# Patient Record
Sex: Male | Born: 1965 | Race: Black or African American | Hispanic: No | Marital: Married | Smoking: Never smoker
Health system: Southern US, Community
[De-identification: ages and names within clinical notes are randomized; demographics above are authoritative.]

---

## 2016-02-13 ENCOUNTER — Emergency Department (HOSPITAL_BASED_OUTPATIENT_CLINIC_OR_DEPARTMENT_OTHER)
Admission: EM | Admit: 2016-02-13 | Discharge: 2016-02-13 | Disposition: A | Discharge status: Home / Self Care | Payer: PRIVATE HEALTH INSURANCE | Attending: Emergency Medicine | Admitting: Emergency Medicine

## 2016-02-13 ENCOUNTER — Emergency Department (HOSPITAL_BASED_OUTPATIENT_CLINIC_OR_DEPARTMENT_OTHER): Payer: PRIVATE HEALTH INSURANCE

## 2016-02-13 ENCOUNTER — Encounter (HOSPITAL_BASED_OUTPATIENT_CLINIC_OR_DEPARTMENT_OTHER): Payer: Self-pay | Admitting: Emergency Medicine

## 2016-02-13 DIAGNOSIS — S6991XA Unspecified injury of right wrist, hand and finger(s), initial encounter: Secondary | ICD-10-CM | POA: Diagnosis present

## 2016-02-13 DIAGNOSIS — Y929 Unspecified place or not applicable: Secondary | ICD-10-CM | POA: Diagnosis not present

## 2016-02-13 DIAGNOSIS — Y939 Activity, unspecified: Secondary | ICD-10-CM | POA: Insufficient documentation

## 2016-02-13 DIAGNOSIS — S63501A Unspecified sprain of right wrist, initial encounter: Secondary | ICD-10-CM | POA: Insufficient documentation

## 2016-02-13 DIAGNOSIS — Y999 Unspecified external cause status: Secondary | ICD-10-CM | POA: Insufficient documentation

## 2016-02-13 DIAGNOSIS — W010XXA Fall on same level from slipping, tripping and stumbling without subsequent striking against object, initial encounter: Secondary | ICD-10-CM | POA: Diagnosis not present

## 2016-02-13 DIAGNOSIS — S80212A Abrasion, left knee, initial encounter: Secondary | ICD-10-CM | POA: Insufficient documentation

## 2016-02-13 DIAGNOSIS — W19XXXA Unspecified fall, initial encounter: Secondary | ICD-10-CM

## 2016-02-13 MED ORDER — HYDROCODONE-ACETAMINOPHEN 5-325 MG PO TABS
2.0000 | ORAL_TABLET | Freq: Once | ORAL | Status: AC
  Administered 2016-02-13: 2 via ORAL
  Filled 2016-02-13: qty 2

## 2016-02-13 MED ORDER — HYDROCODONE-ACETAMINOPHEN 5-325 MG PO TABS: 1.0000 | ORAL_TABLET | Freq: Four times a day (QID) | ORAL | Status: AC | PRN

## 2016-02-13 NOTE — ED Notes (Signed)
CMS intact before and after. Pt tolerated well. Pt had no questions.  

## 2016-02-13 NOTE — Discharge Instructions (Signed)
X-rays showed no broken bones. Wear the splint as needed for comfort. Take Tylenol for mild pain or the pain medicine prescribed for bad pain. Don't take Tylenol together with the pain medicine prescribed as the combination can be dangerous to your liver. Wash the abrasion on your knee daily with soap and water and then place a thin layer of bacitracin ointment over the wound and cover with a sterile bandage. Signs of infection include redness around the wound, more swelling, more pain or drainage from the wound. If you think you might be getting infected, return or see your primary care physician. See your primary care physician if continued to have significant pain or not improving in a week

## 2016-02-13 NOTE — ED Provider Notes (Signed)
CSN: [id number]     Arrival date & time 02/13/16  1803 History   By signing my name below, I, Arianna Nassar, attest that this documentation has been prepared under the direction and in the presence of Doug Sou, MD. Electronically Signed: Octavia Heir, ED Scribe. 02/13/2016. 6:25 PM.      Chief Complaint  Patient presents with  . Fall      The history is provided by the patient. No language interpreter was used.   HPI Comments: Omar Martinez is a 50 y.o. male who presents to the Emergency Department complaining of a fall that occurred about 30 minutes ago.He reports that he tripped on uneven pavement He complains of constant, gradual worsening, moderate, throbbing right wrist pain with associated burning left knee pain. Pt reports he stepped off of a curb, tumbled over, caught himself with his right wrist, and landed on his left knee. He has not taken any medication to alleviate his pain. He denies drug use and smoking. Pt states he is an occasional drinker. Last tetanus shot 2 years ago  History reviewed. No pertinent past medical history. History reviewed. No pertinent past surgical history. Past medical history negative History reviewed. No pertinent family history. Social History  Substance Use Topics  . Smoking status: Never Smoker   . Smokeless tobacco: None  . Alcohol Use: Yes     Comment: socially    Review of Systems  Constitutional: Negative.   HENT: Negative.   Respiratory: Negative.   Cardiovascular: Negative.   Gastrointestinal: Negative.   Musculoskeletal: Positive for arthralgias.  Skin: Positive for wound.       Abrasion to left knee  Neurological: Negative.   Psychiatric/Behavioral: Negative.       Allergies  Review of patient's allergies indicates no known allergies.  Home Medications   Prior to Admission medications   Not on File   Triage vitals: BP 132/87 mmHg  Pulse 106  Temp(Src) 98.3 F (36.8 C) (Oral)  Resp 18  Ht  (1.854 m)   Wt 235 lb (106.595 kg)  BMI 31.01 kg/m2  SpO2 99% Physical Exam  Constitutional: He is oriented to person, place, and time. He appears well-developed and well-nourished.  HENT:  Head: Normocephalic and atraumatic.  Eyes: Conjunctivae are normal. Pupils are equal, round, and reactive to light.  Neck: Neck supple. No tracheal deviation present. No thyromegaly present.  Cardiovascular: Normal rate and regular rhythm.   No murmur heard. Pulmonary/Chest: Effort normal and breath sounds normal.  Abdominal: Soft. Bowel sounds are normal. He exhibits no distension. There is no tenderness.  Musculoskeletal: Normal range of motion. He exhibits tenderness. He exhibits no edema.  Left upper extremity no swelling no deformity minimally tender dorsum of left wrist. No anatomic snuffbox tenderness. Full range of motion. Radial pulse 2+. Good capillary refill. Left lower extremity 3 cm abrasion to anterior knee with mild corresponding tenderness. No deformity. All other extremities without contusion abrasion or tenderness neurovascularly intact  Neurological: He is alert and oriented to person, place, and time. No cranial nerve deficit. Coordination normal.  Gait normal. Walks without limp  Skin: Skin is warm and dry. No rash noted.  Psychiatric: He has a normal mood and affect.  Nursing note and vitals reviewed.   ED Course  Procedures  DIAGNOSTIC STUDIES: Oxygen Saturation is 99% on RA, normal by my interpretation.  COORDINATION OF CARE:  6:25 PM Will order x-ray of right wrist and left knee. Discussed treatment plan with pt at bedside  and pt agreed to plan.  Labs Review Labs Reviewed - No data to display  Imaging Review No results found. I have personally reviewed and evaluated these images and lab results as part of my medical decision-making.   EKG Interpretation None     Declines pain medicine 6:55 PM requesting pain medicine. Right wrist is sore. Norco ordered as well as Velcro  wrist splint. Abrasion at left knee was cleansed, antibiotic ointment applied. X-rays viewed by me No results found for this or any previous visit. Dg Wrist Complete Right  02/13/2016  CLINICAL DATA:  Right wrist pain status post fall. EXAM: RIGHT WRIST - COMPLETE 3+ VIEW COMPARISON:  None. FINDINGS: There is no evidence of fracture or dislocation. There is no evidence of arthropathy or other focal bone abnormality. Soft tissues are unremarkable. IMPRESSION: Negative. Electronically Signed   By: Ted Mcalpineobrinka  Dimitrova M.D.   On: 02/13/2016 18:46   Dg Knee Complete 4 Views Left  02/13/2016  CLINICAL DATA:  Left knee pain status post fall. EXAM: LEFT KNEE - COMPLETE 4+ VIEW COMPARISON:  None. FINDINGS: No evidence of fracture, dislocation, or joint effusion. No evidence of arthropathy or other focal bone abnormality. Soft tissues are unremarkable. IMPRESSION: Negative. Electronically Signed   By: Ted Mcalpineobrinka  Dimitrova M.D.   On: 02/13/2016 18:47    MDM  Plan Velcro wrist splint. Prescription Norco. Local wound care to left knee. Follow-up with PMD if having significant pain by next week Diagnosis #1 fall  #2 sprain of right wrist #3 abrasion to left knee  Final diagnoses:  None     I personally performed the services described in this documentation, which was scribed in my presence. The recorded information has been reviewed and considered.    Doug SouSam Evanny Ellerbe, MD 02/13/16 [phone]

## 2016-02-13 NOTE — ED Notes (Signed)
Patient states that he fell earlier today. Pain to his left knee and to his right wrist.

## 2020-08-13 ENCOUNTER — Emergency Department (HOSPITAL_BASED_OUTPATIENT_CLINIC_OR_DEPARTMENT_OTHER)
Admission: EM | Admit: 2020-08-13 | Discharge: 2020-08-13 | Disposition: A | Discharge status: Home / Self Care | Payer: BC Managed Care – PPO | Attending: Emergency Medicine | Admitting: Emergency Medicine

## 2020-08-13 ENCOUNTER — Other Ambulatory Visit: Payer: Self-pay

## 2020-08-13 ENCOUNTER — Emergency Department (HOSPITAL_BASED_OUTPATIENT_CLINIC_OR_DEPARTMENT_OTHER): Payer: BC Managed Care – PPO

## 2020-08-13 ENCOUNTER — Encounter (HOSPITAL_BASED_OUTPATIENT_CLINIC_OR_DEPARTMENT_OTHER): Payer: Self-pay | Admitting: Emergency Medicine

## 2020-08-13 DIAGNOSIS — W11XXXA Fall on and from ladder, initial encounter: Secondary | ICD-10-CM | POA: Insufficient documentation

## 2020-08-13 DIAGNOSIS — S8264XA Nondisplaced fracture of lateral malleolus of right fibula, initial encounter for closed fracture: Secondary | ICD-10-CM | POA: Insufficient documentation

## 2020-08-13 DIAGNOSIS — M25511 Pain in right shoulder: Secondary | ICD-10-CM | POA: Diagnosis not present

## 2020-08-13 DIAGNOSIS — S99921A Unspecified injury of right foot, initial encounter: Secondary | ICD-10-CM | POA: Diagnosis present

## 2020-08-13 DIAGNOSIS — Y92009 Unspecified place in unspecified non-institutional (private) residence as the place of occurrence of the external cause: Secondary | ICD-10-CM | POA: Insufficient documentation

## 2020-08-13 DIAGNOSIS — W19XXXA Unspecified fall, initial encounter: Secondary | ICD-10-CM

## 2020-08-13 DIAGNOSIS — S82891A Other fracture of right lower leg, initial encounter for closed fracture: Secondary | ICD-10-CM

## 2020-08-13 MED ORDER — SENNOSIDES-DOCUSATE SODIUM 8.6-50 MG PO TABS: 1.0000 | ORAL_TABLET | Freq: Every day | ORAL | 0 refills | Status: AC

## 2020-08-13 MED ORDER — IBUPROFEN 600 MG PO TABS: 600.0000 mg | ORAL_TABLET | Freq: Four times a day (QID) | ORAL | 0 refills | Status: AC | PRN

## 2020-08-13 MED ORDER — OXYCODONE-ACETAMINOPHEN 5-325 MG PO TABS
1.0000 | ORAL_TABLET | Freq: Once | ORAL | Status: AC
  Administered 2020-08-13: 1 via ORAL
  Filled 2020-08-13: qty 1

## 2020-08-13 MED ORDER — OXYCODONE-ACETAMINOPHEN 5-325 MG PO TABS: 1.0000 | ORAL_TABLET | Freq: Four times a day (QID) | ORAL | 0 refills | Status: AC | PRN

## 2020-08-13 MED ORDER — IBUPROFEN 800 MG PO TABS
800.0000 mg | ORAL_TABLET | Freq: Once | ORAL | Status: AC
  Administered 2020-08-13: 800 mg via ORAL
  Filled 2020-08-13: qty 1

## 2020-08-13 NOTE — ED Triage Notes (Signed)
Pt reports he fell approximately 10 feet off of a ladder inside his house while hanging christmas decorations yesterday. He landed on his R side. C/o R shoulder and R heel pain. Denies LOC or hitting his head, denies neck or back pain.

## 2020-08-13 NOTE — Discharge Instructions (Addendum)
You have a fracture on the side of your right ankle that we found on the CT scan.  We placed you in a cam boot for this and gave you crutches.  This boot will help stabilize your leg to prevent it from giving out and worsening your fracture.  You should use it at all times when walking.  I advise that you limit as much as possible putting your weight on this right leg.  I understand that you will need to apply some light touches when walking.  I advised that you keep your leg elevated at home as much as possible.  You should call and schedule an appointment with Dr Dion Saucier in 1-2 weeks in the office for this.  Your CT scan did NOT show a fracture of your right shoulder.  You can have the orthopedic doctor take another look at this again in the office, but for now you can use your right arm for basic activities.  Avoid sports or heavy lifting with this arm.  You can continue taking Motrin at home for pain.  I also prescribed you a few Percocet tablets for breakthrough pain.  I prescribed you some stool softener to take with this, as Percocet can often cause constipation.

## 2020-08-13 NOTE — ED Notes (Signed)
PT getting dressed  

## 2020-08-13 NOTE — ED Provider Notes (Signed)
[facility] EMERGENCY DEPARTMENT Provider Note   CSN: [id number] Arrival date & time: 08/13/20  0915     History Chief Complaint  Patient presents with  . Fall    Omar Martinez is a 54 y.o. male with no significant past medical history presented emergency department with mechanical fall off a ladder at home.  The patient was approximately 8 feet in the air decorating his house for Christmas.  He says the ladder gave out underneath him, and he fell to the ground in his house.  He thinks he may have landed on his right foot and heel and then onto his right shoulder.  He was reporting significant pain in his right heel, only able to limp due to pain.  He is also having pain in his right shoulder but is able to range the shoulder fully.  He denies any numbness or weakness of the extremities.  He denies any head trauma or loss of consciousness.  He is not on blood thinners.  He denies any low back pain.  He denies pain anywhere else on his body.  HPI     History reviewed. No pertinent past medical history.  There are no problems to display for this patient.   History reviewed. No pertinent surgical history.     No family history on file.  Social History   Tobacco Use  . Smoking status: Never Smoker  . Smokeless tobacco: Never Used  Substance Use Topics  . Alcohol use: Yes    Comment: socially  . Drug use: Not on file    Home Medications Prior to Admission medications   Medication Sig Start Date End Date Taking? Authorizing Provider  HYDROcodone-acetaminophen (NORCO) 5-325 MG tablet Take 1-2 tablets by mouth every 6 (six) hours as needed for moderate pain or severe pain. 02/13/16   Doug Sou, MD  ibuprofen (ADVIL) 600 MG tablet Take 1 tablet (600 mg total) by mouth every 6 (six) hours as needed for up to 30 doses for moderate pain. 08/13/20   Terald Sleeper, MD  oxyCODONE-acetaminophen (PERCOCET/ROXICET) 5-325 MG tablet Take 1 tablet by mouth every 6 (six)  hours as needed for up to 3 days for severe pain. 08/13/20 08/16/20  Terald Sleeper, MD  senna-docusate (SENOKOT-S) 8.6-50 MG tablet Take 1 tablet by mouth daily for 15 doses. Take with percocet to prevent constipation 08/13/20 08/28/20  Terald Sleeper, MD    Allergies    Patient has no known allergies.  Review of Systems   Review of Systems  Constitutional: Negative for chills and fever.  HENT: Negative for ear pain and sore throat.   Eyes: Negative for photophobia and visual disturbance.  Respiratory: Negative for cough and shortness of breath.   Cardiovascular: Negative for chest pain and palpitations.  Gastrointestinal: Negative for abdominal pain and vomiting.  Musculoskeletal: Positive for arthralgias and myalgias. Negative for back pain.  Skin: Negative for color change and rash.  Neurological: Negative for weakness and numbness.  All other systems reviewed and are negative.   Physical Exam Updated Vital Signs BP (!) 136/91 (BP Location: Right Arm)   Pulse 72   Temp 98.9 F (37.2 C) (Oral)   Resp 19   Ht 6\' 1"  (1.854 m)   Wt 106.6 kg   SpO2 97%   BMI 31.00 kg/m   Physical Exam Vitals and nursing note reviewed.  Constitutional:      Appearance: He is well-developed.  HENT:     Head: Normocephalic and  atraumatic.  Eyes:     Conjunctiva/sclera: Conjunctivae normal.  Cardiovascular:     Rate and Rhythm: Normal rate and regular rhythm.     Pulses: Normal pulses.  Pulmonary:     Effort: Pulmonary effort is normal. No respiratory distress.     Breath sounds: Normal breath sounds.  Abdominal:     General: There is no distension.     Palpations: Abdomen is soft.     Tenderness: There is no abdominal tenderness.  Musculoskeletal:     Cervical back: Neck supple.     Comments: TTP of mid-humerus right side with no gross deformity or bruising Full ROM of the joints and extremities No ttp of the chest or pelvis +TTP of right calcaneus, no midfoot or posterior  malleolar ttp  Skin:    General: Skin is warm and dry.  Neurological:     General: No focal deficit present.     Mental Status: He is alert and oriented to person, place, and time.     Sensory: No sensory deficit.     Motor: No weakness.     Comments: No spinal midline tenderness  Psychiatric:        Mood and Affect: Mood normal.        Behavior: Behavior normal.     ED Results / Procedures / Treatments   Labs (all labs ordered are listed, but only abnormal results are displayed) Labs Reviewed - No data to display  EKG None  Radiology DG Shoulder Right  Result Date: 08/13/2020 CLINICAL DATA:  Fall from ladder with shoulder pain, initial encounter EXAM: RIGHT SHOULDER - 2+ VIEW COMPARISON:  None. FINDINGS: There is a lucency noted in the proximal humeral shaft seen only on the trans axillary view suspicious for undisplaced fracture. No other focal abnormality is noted. Cross-sectional imaging may be helpful for further evaluation. IMPRESSION: Oblique lucency seen on the trans axillary view only suspicious for undisplaced cortical fracture. Electronically Signed   By: Alcide Clever M.D.   On: 08/13/2020 10:04   CT HUMERUS RIGHT WO CONTRAST  Result Date: 08/13/2020 CLINICAL DATA:  Status post fall, humeral pain. EXAM: CT OF THE RIGHT HUMERUS WITHOUT CONTRAST TECHNIQUE: Multidetector CT imaging was performed according to the standard protocol. Multiplanar CT image reconstructions were also generated. COMPARISON:  None. FINDINGS: Bones/Joint/Cartilage No fracture or dislocation. Normal alignment. No joint effusion. Ligaments Ligaments are suboptimally evaluated by CT. Muscles and Tendons Muscles are normal.  No intramuscular fluid collection or hematoma. Soft tissue No fluid collection or hematoma.  No soft tissue mass. IMPRESSION: No acute osseous injury of the right shoulder. Electronically Signed   By: Elige Ko   On: 08/13/2020 11:43   CT Foot Right Wo Contrast  Result Date:  08/13/2020 CLINICAL DATA:  Status post fall from a ladder hanging Christmas lights. Calcaneal pain. EXAM: CT OF THE RIGHT FOOT WITHOUT CONTRAST TECHNIQUE: Multidetector CT imaging of the right foot was performed according to the standard protocol. Multiplanar CT image reconstructions were also generated. COMPARISON:  None. FINDINGS: Bones/Joint/Cartilage Nondisplaced transverse fracture of the distal tip of the lateral malleolus. Normal alignment. No joint effusion. 9 mm osteochondral lesion of the posterolateral corner of the talar dome. Mild osteoarthritis of the tibiotalar joint. Small loose body in the posterior ankle joint space. 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 Ligaments Ligaments are  suboptimally evaluated by CT. Muscles and Tendons Muscles are normal. No muscle atrophy. No intramuscular fluid collection or hematoma. Soft tissue No fluid collection or hematoma.  No soft tissue mass. IMPRESSION: 1. Nondisplaced transverse fracture of the distal tip of the lateral malleolus. 2. A 9 mm osteochondral lesion of the posterolateral corner of the talar dome. Mild osteoarthritis of the tibiotalar joint. Electronically Signed   By: Elige Ko   On: 08/13/2020 12:18   DG Foot Complete Right  Result Date: 08/13/2020 CLINICAL DATA:  Fall from ladder. EXAM: RIGHT FOOT COMPLETE - 3+ VIEW COMPARISON:  None. FINDINGS: There is no evidence of fracture or dislocation. There is no evidence of arthropathy or other focal bone abnormality. Soft tissues are unremarkable. IMPRESSION: Negative. Electronically Signed   By: Kennith Center M.D.   On: 08/13/2020 10:04    Procedures Procedures (including critical care time)  Medications Ordered in ED Medications  oxyCODONE-acetaminophen (PERCOCET/ROXICET) 5-325 MG per tablet 1 tablet (1 tablet Oral Given  08/13/20 1055)  ibuprofen (ADVIL) tablet 800 mg (800 mg Oral Given 08/13/20 1055)    ED Course  I have reviewed the triage vital signs and the nursing notes.  Pertinent labs & imaging results that were available during my care of the patient were reviewed by me and considered in my medical decision making (see chart for details).  This is a 54% emergency department a fall from approximately 8 feet off a ladder yesterday.  He has pain and tenderness in the right calcaneus.  X-rays reviewed showed no acute fracture, however I do think a CT scan to be warranted as I have a strong suspicion for fracture here.  He has no lumbar or lower back pain whatsoever.  He has no tenderness over the spine.  I have a low suspicion for a concurrent lumbar fracture at this time.  X-ray of the right humerus shows a possible hairline fracture of the mid humerus.  This correlates with area of tenderness on exam.  We will pursue a CT scan to better evaluate this.  He has no tenderness of the scapula or evidence of dislocation of the shoulder.  Remainder of his trauma evaluation is benign.  No evidence of head trauma.  No evidence of chest trauma.  No pelvic instability or abdominal pain.  We can give him p.o. Percocet and Motrin for pain while he is here.  His wife will be driving him.  Clinical Course as of Aug 14 1032  Sun Aug 13, 2020  1153 No acute fracture on humerus xray    [MT]  1222 IMPRESSION: 1. Nondisplaced transverse fracture of the distal tip of the lateral malleolus. 2. A 9 mm osteochondral lesion of the posterolateral corner of the talar dome. Mild osteoarthritis of the tibiotalar joint.   [MT]  1248 I discussed the patient CT findings with him.  I would recommend he follow-up with orthopedic doctor.  We discussed a splint with crutches, but given the pain he is having in his right arm, does not feel like he may be able to keep off this leg completely.  The alternative would be a cam boot, which  can still provide some stability to his ankle, until he can see an orthopedic doctor.  Advised him to try to keep off of this leg is much as possible and apply light touch only when ambulating.  We will still provide him with some crutches.  He verbalized understanding and agrees with this plan.   [MT]  Clinical Course User Index [MT] Terald Sleeperrifan, Corben Auzenne J, MD    Final Clinical Impression(s) / ED Diagnoses Final diagnoses:  Closed fracture of right ankle, initial encounter  Acute pain of right shoulder  Fall, initial encounter    Rx / DC Orders ED Discharge Orders         Ordered    ibuprofen (ADVIL) 600 MG tablet  Every 6 hours PRN        08/13/20 1254    oxyCODONE-acetaminophen (PERCOCET/ROXICET) 5-325 MG tablet  Every 6 hours PRN        08/13/20 1254    senna-docusate (SENOKOT-S) 8.6-50 MG tablet  Daily        08/13/20 1254           Terald Sleeperrifan, Maynor Mwangi J, MD 08/14/20 [phone]

## 2022-05-26 IMAGING — DX DG SHOULDER 2+V*R*
3 series · 3 of 3 positions shown · non-contrast
Comparison: None.

CLINICAL DATA: Fall from ladder with shoulder pain, initial
encounter

EXAM:
RIGHT SHOULDER - 2+ VIEW

[shoulder grashey]
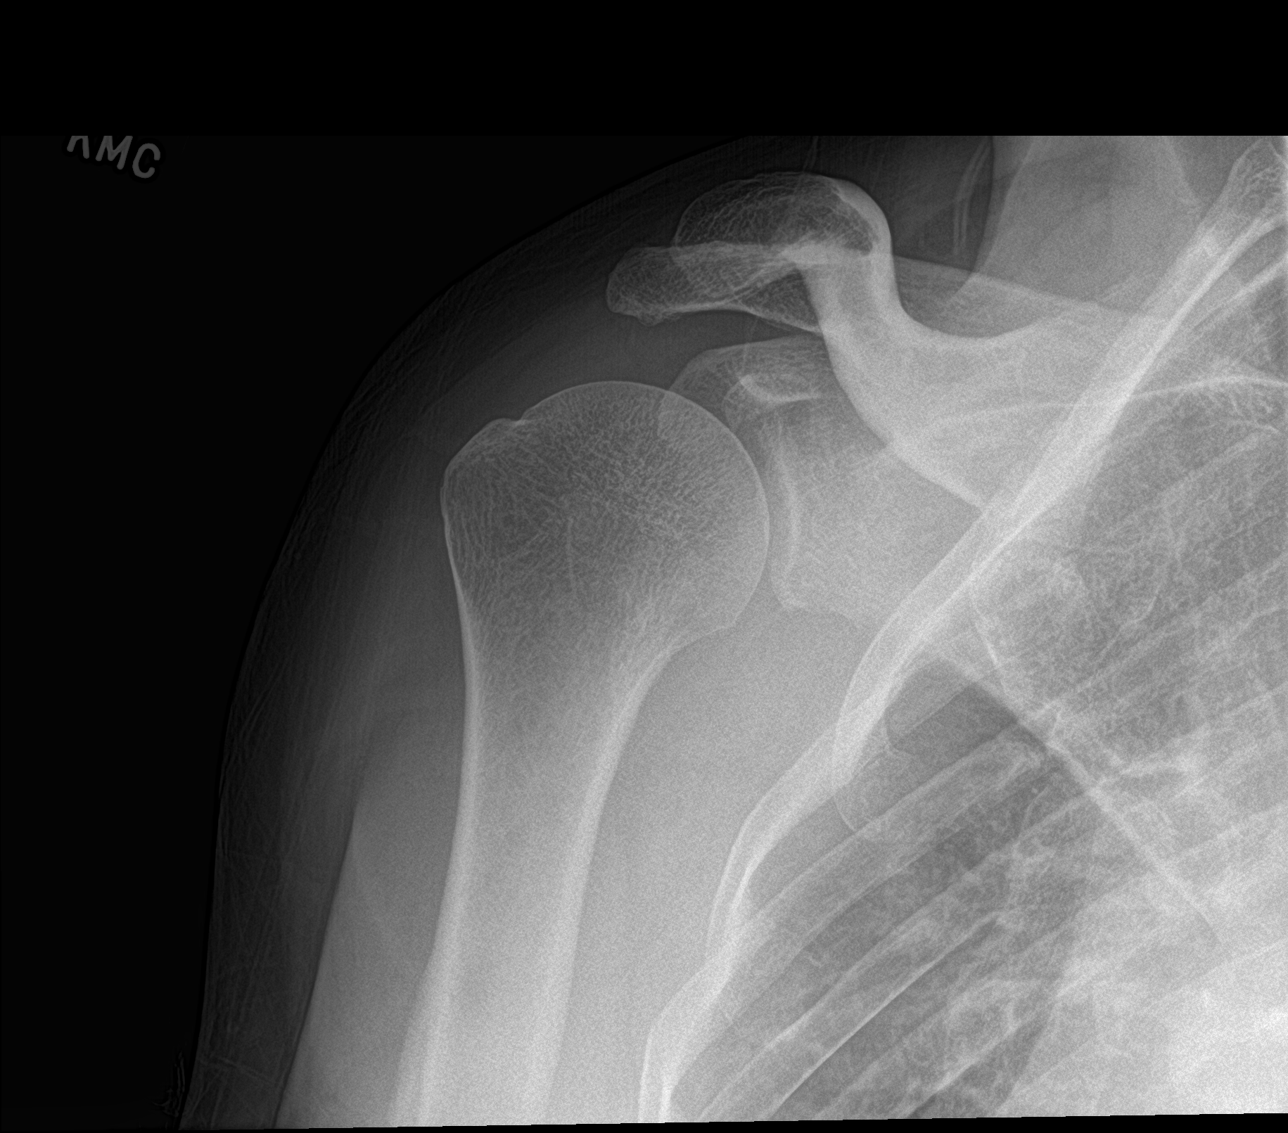

[shoulder y view]
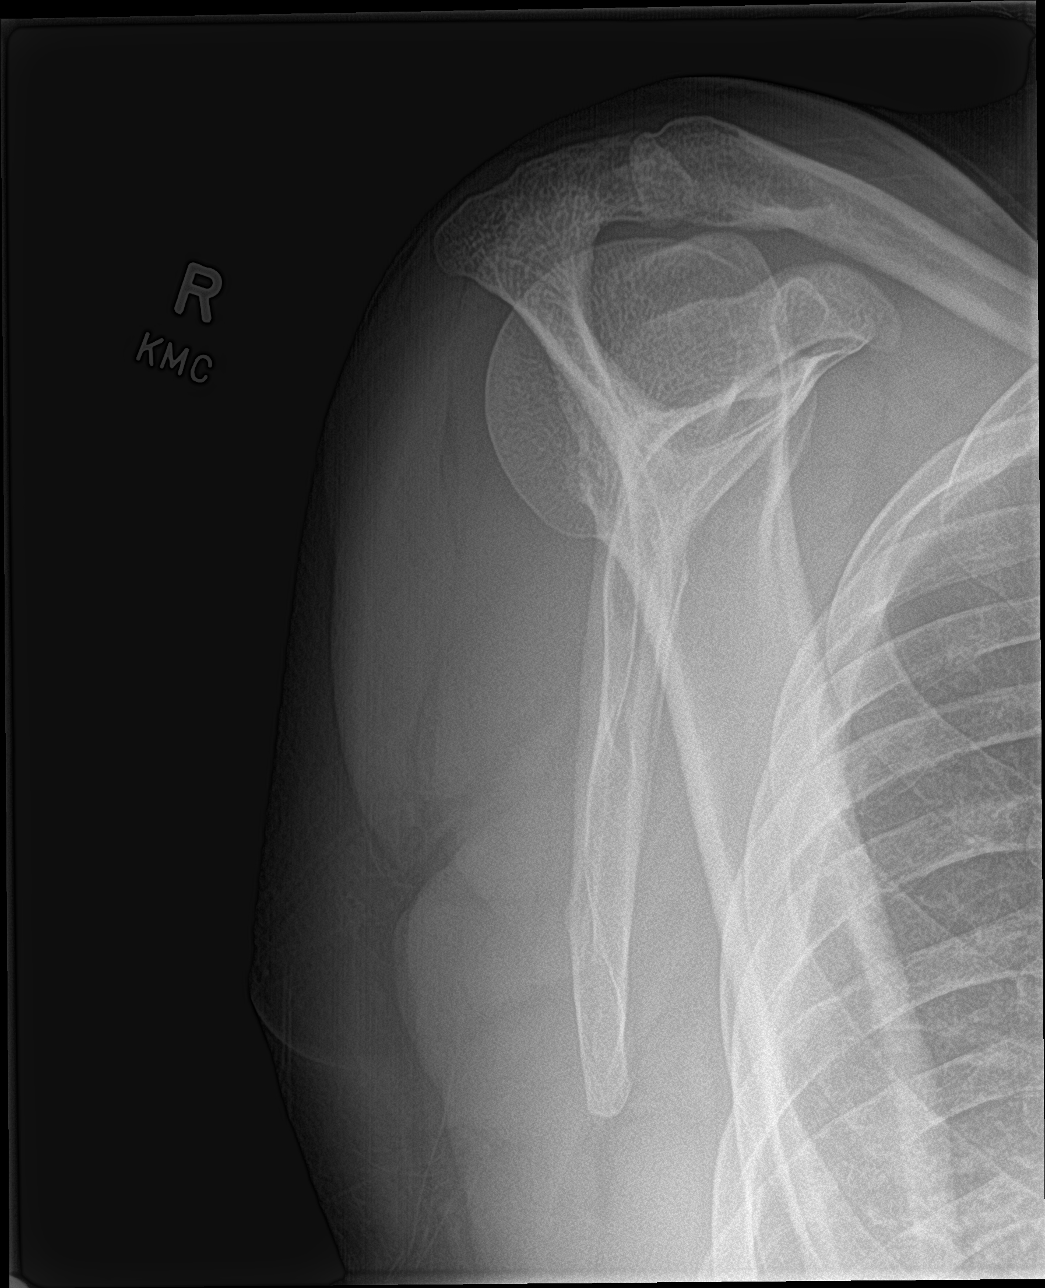

[shoulder axillary]
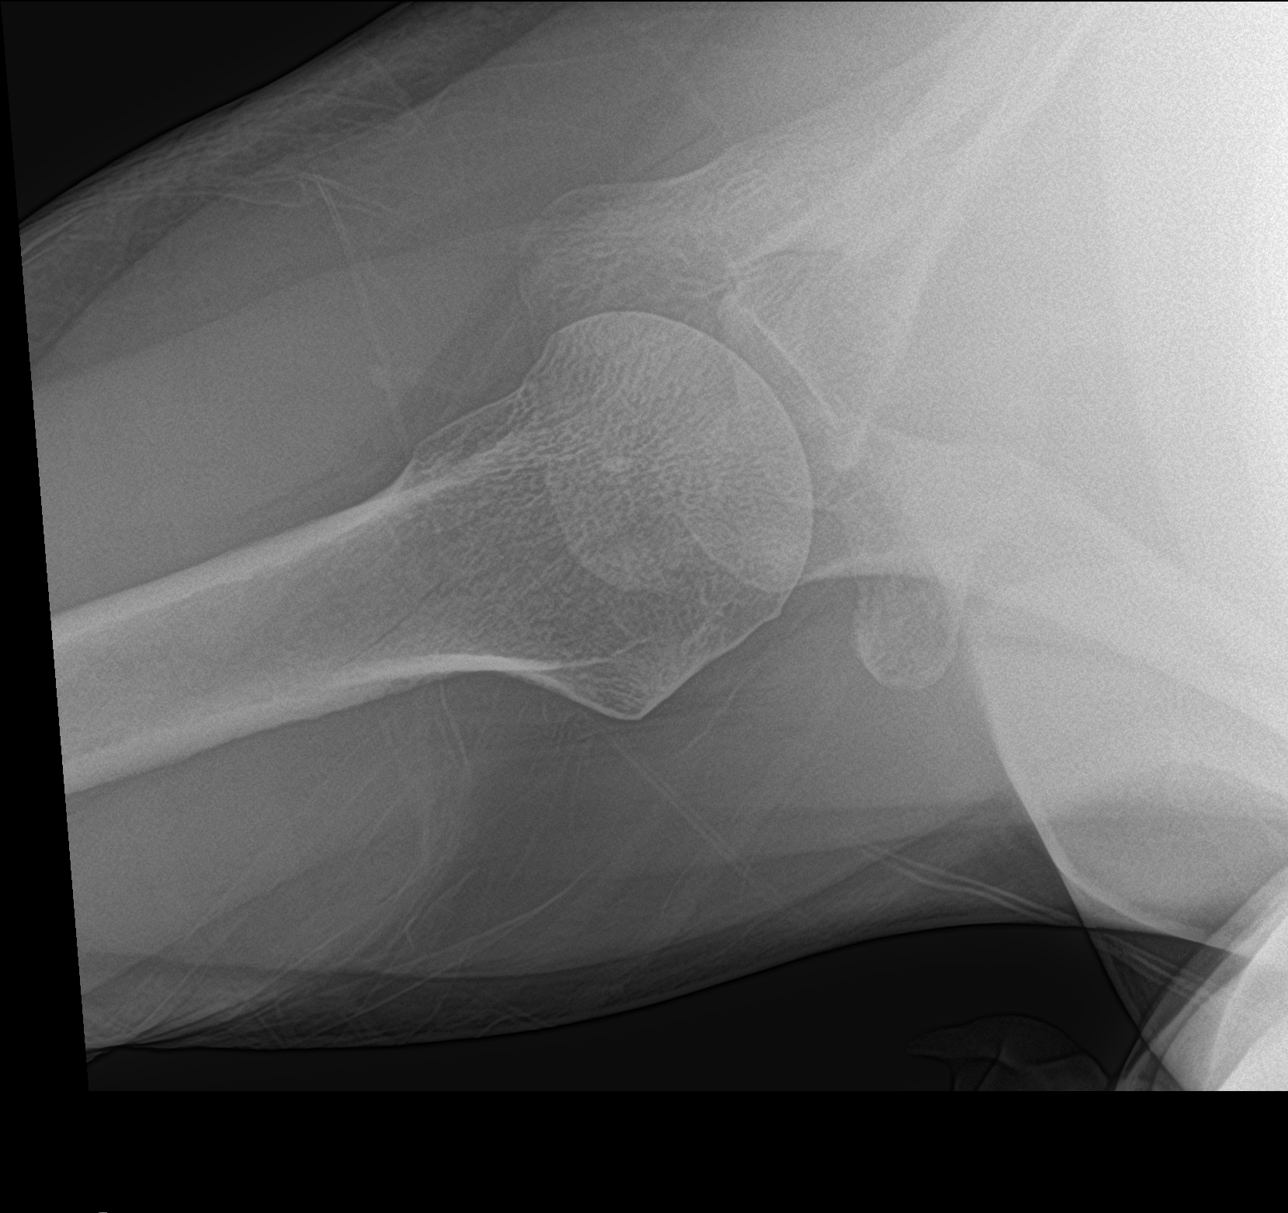

[3 of 3 positions shown; findings below may reference images not displayed]

FINDINGS: There is a lucency noted in the proximal humeral shaft seen only on
the trans axillary view suspicious for undisplaced fracture. No
other focal abnormality is noted. Cross-sectional imaging may be
helpful for further evaluation.
IMPRESSION: Oblique lucency seen on the trans axillary view only suspicious for
undisplaced cortical fracture.
